# Patient Record
Sex: Male | Born: 2000 | Race: Black or African American | Hispanic: No | Marital: Single | Smoking: Never smoker
Health system: Southern US, Community
[De-identification: ages and names within clinical notes are randomized; demographics above are authoritative.]

## PROBLEM LIST (undated history)

## (undated) DIAGNOSIS — G4730 Sleep apnea, unspecified: Secondary | ICD-10-CM

## (undated) HISTORY — PX: OTHER SURGICAL HISTORY: SHX169

---
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
## 2022-04-01 ENCOUNTER — Other Ambulatory Visit: Payer: Self-pay

## 2022-04-01 ENCOUNTER — Emergency Department
Admission: EM | Admit: 2022-04-01 | Discharge: 2022-04-01 | Disposition: A | Discharge status: Home / Self Care | Payer: BC Managed Care – PPO | Attending: Emergency Medicine | Admitting: Emergency Medicine

## 2022-04-01 DIAGNOSIS — R509 Fever, unspecified: Secondary | ICD-10-CM | POA: Diagnosis present

## 2022-04-01 DIAGNOSIS — J101 Influenza due to other identified influenza virus with other respiratory manifestations: Secondary | ICD-10-CM | POA: Insufficient documentation

## 2022-04-01 DIAGNOSIS — Z20822 Contact with and (suspected) exposure to covid-19: Secondary | ICD-10-CM | POA: Insufficient documentation

## 2022-04-01 LAB — RESP PANEL BY RT-PCR (RSV, FLU A&B, COVID)  RVPGX2
Influenza A by PCR: POSITIVE — AB
Influenza B by PCR: NEGATIVE
Resp Syncytial Virus by PCR: NEGATIVE
SARS Coronavirus 2 by RT PCR: NEGATIVE

## 2022-04-01 MED ORDER — FLUTICASONE PROPIONATE 50 MCG/ACT NA SUSP: 1.0000 | Freq: Every day | NASAL | 2 refills | Status: DC

## 2022-04-01 MED ORDER — BENZONATATE 100 MG PO CAPS: 100.0000 mg | ORAL_CAPSULE | Freq: Three times a day (TID) | ORAL | 0 refills | Status: AC | PRN

## 2022-04-01 MED ORDER — ALBUTEROL SULFATE HFA 108 (90 BASE) MCG/ACT IN AERS: 2.0000 | INHALATION_SPRAY | Freq: Four times a day (QID) | RESPIRATORY_TRACT | 2 refills | Status: DC | PRN

## 2022-04-01 MED ORDER — ACETAMINOPHEN 325 MG PO TABS
650.0000 mg | ORAL_TABLET | Freq: Once | ORAL | Status: AC | PRN
  Administered 2022-04-01: 650 mg via ORAL
  Filled 2022-04-01: qty 2

## 2022-04-01 NOTE — ED Triage Notes (Signed)
Pt ambulatory to triage c/o flu like symptoms. Pt having cough, body aches, congestion, fever, chills since 2 days ago. Pt in NAD at this time.

## 2022-04-01 NOTE — ED Provider Notes (Signed)
[facility] Provider Note  Patient Contact: 10:20 PM (approximate)   History   No chief complaint on file.   HPI  Terry Becker is a 21 y.o. male presents to the emergency department with cough, fever, headache and bodyaches for the past 2 to 3 days.  No vomiting or diarrhea.  No chest pain or abdominal pain      Physical Exam   Triage Vital Signs: ED Triage Vitals  Enc Vitals Group     BP 04/01/22 1949 (!) 140/93     Pulse Rate 04/01/22 1949 (!) 114     Resp 04/01/22 1949 17     Temp 04/01/22 1949 (!) 100.9 F (38.3 C)     Temp Source 04/01/22 1949 Oral     SpO2 04/01/22 1949 97 %     Weight 04/01/22 1950 245 lb (111.1 kg)     Height 04/01/22 1950 5\' 10"  (1.778 m)     Head Circumference --      Peak Flow --      Pain Score 04/01/22 1949 6     Pain Loc --      Pain Edu? --      Excl. in [place]? --     Most recent vital signs: Vitals:   04/01/22 1949  BP: (!) 140/93  Pulse: (!) 114  Resp: 17  Temp: (!) 100.9 F (38.3 C)  SpO2: 97%     Constitutional: Alert and oriented. Patient is lying supine. Eyes: Conjunctivae are normal. PERRL. EOMI. Head: Atraumatic. ENT:      Ears: Tympanic membranes are mildly injected with mild effusion bilaterally.       Nose: No congestion/rhinnorhea.      Mouth/Throat: Mucous membranes are moist. Posterior pharynx is mildly erythematous.  Hematological/Lymphatic/Immunilogical: No cervical lymphadenopathy.  Cardiovascular: Normal rate, regular rhythm. Normal S1 and S2.  Good peripheral circulation. Respiratory: Normal respiratory effort without tachypnea or retractions. Lungs CTAB. Good air entry to the bases with no decreased or absent breath sounds. Gastrointestinal: Bowel sounds 4 quadrants. Soft and nontender to palpation. No guarding or rigidity. No palpable masses. No distention. No CVA tenderness. Musculoskeletal: Full range of motion to all extremities. No gross deformities  appreciated. Neurologic:  Normal speech and language. No gross focal neurologic deficits are appreciated.  Skin:  Skin is warm, dry and intact. No rash noted. Psychiatric: Mood and affect are normal. Speech and behavior are normal. Patient exhibits appropriate insight and judgement.   ED Results / Procedures / Treatments   Labs (all labs ordered are listed, but only abnormal results are displayed) Labs Reviewed  RESP PANEL BY RT-PCR (RSV, FLU A&B, COVID)  [id number] - Abnormal; Notable for the following components:      Result Value   Influenza A by PCR POSITIVE (*)    All other components within normal limits        PROCEDURES:  Critical Care performed: No  Procedures   MEDICATIONS ORDERED IN ED: Medications  acetaminophen (TYLENOL) tablet 650 mg (650 mg Oral Given 04/01/22 1955)     IMPRESSION / MDM / ASSESSMENT AND PLAN / ED COURSE  I reviewed the triage vital signs and the nursing notes.                              Assessment and plan Influenza A 21 year old male presents to the emergency department with flulike symptoms.  Patient tested positive for influenza A.  Rest medication..  Supportive medications were prescribed at discharge.  Patient     FINAL CLINICAL IMPRESSION(S) / ED DIAGNOSES   Final diagnoses:  Influenza A     Rx / DC Orders   ED Discharge Orders          Ordered    benzonatate (TESSALON PERLES) 100 MG capsule  3 times daily PRN        04/01/22 2217    benzonatate (TESSALON PERLES) 100 MG capsule  3 times daily PRN        04/01/22 2217    albuterol (VENTOLIN HFA) 108 (90 Base) MCG/ACT inhaler  Every 6 hours PRN        04/01/22 2217    fluticasone (FLONASE) 50 MCG/ACT nasal spray  Daily        04/01/22 2217             Note:  This document was prepared using Dragon voice recognition software and may include unintentional dictation errors.   Pia Mau Gurdon, Cordelia Poche 04/01/22 2221    Phineas Semen, MD 04/01/22 2325

## 2022-04-24 ENCOUNTER — Emergency Department
Admission: EM | Admit: 2022-04-24 | Discharge: 2022-04-24 | Disposition: A | Discharge status: Home / Self Care | Payer: BC Managed Care – PPO | Attending: Emergency Medicine | Admitting: Emergency Medicine

## 2022-04-24 ENCOUNTER — Other Ambulatory Visit: Payer: Self-pay

## 2022-04-24 DIAGNOSIS — R1010 Upper abdominal pain, unspecified: Secondary | ICD-10-CM | POA: Diagnosis present

## 2022-04-24 DIAGNOSIS — R109 Unspecified abdominal pain: Secondary | ICD-10-CM

## 2022-04-24 DIAGNOSIS — R112 Nausea with vomiting, unspecified: Secondary | ICD-10-CM | POA: Insufficient documentation

## 2022-04-24 LAB — CBC
HCT: 45.7 % (ref 39.0–52.0)
Hemoglobin: 14.4 g/dL (ref 13.0–17.0)
MCH: 24.4 pg — ABNORMAL LOW (ref 26.0–34.0)
MCHC: 31.5 g/dL (ref 30.0–36.0)
MCV: 77.5 fL — ABNORMAL LOW (ref 80.0–100.0)
Platelets: 387 10*3/uL (ref 150–400)
RBC: 5.9 MIL/uL — ABNORMAL HIGH (ref 4.22–5.81)
RDW: 15.7 % — ABNORMAL HIGH (ref 11.5–15.5)
WBC: 9.3 10*3/uL (ref 4.0–10.5)
nRBC: 0 % (ref 0.0–0.2)

## 2022-04-24 LAB — URINALYSIS, ROUTINE W REFLEX MICROSCOPIC
Bilirubin Urine: NEGATIVE
Glucose, UA: NEGATIVE mg/dL
Hgb urine dipstick: NEGATIVE
Ketones, ur: NEGATIVE mg/dL
Leukocytes,Ua: NEGATIVE
Nitrite: NEGATIVE
Protein, ur: NEGATIVE mg/dL
Specific Gravity, Urine: 1.013 (ref 1.005–1.030)
pH: 7 (ref 5.0–8.0)

## 2022-04-24 LAB — COMPREHENSIVE METABOLIC PANEL
ALT: 55 U/L — ABNORMAL HIGH (ref 0–44)
AST: 32 U/L (ref 15–41)
Albumin: 4.5 g/dL (ref 3.5–5.0)
Alkaline Phosphatase: 84 U/L (ref 38–126)
Anion gap: 5 (ref 5–15)
BUN: 10 mg/dL (ref 6–20)
CO2: 29 mmol/L (ref 22–32)
Calcium: 9.4 mg/dL (ref 8.9–10.3)
Chloride: 103 mmol/L (ref 98–111)
Creatinine, Ser: 0.95 mg/dL (ref 0.61–1.24)
GFR, Estimated: >60 mL/min (ref >60)
Glucose, Bld: 103 mg/dL — ABNORMAL HIGH (ref 70–99)
Potassium: 4 mmol/L (ref 3.5–5.1)
Sodium: 137 mmol/L (ref 135–145)
Total Bilirubin: 0.9 mg/dL (ref 0.3–1.2)
Total Protein: 7.9 g/dL (ref 6.5–8.1)

## 2022-04-24 LAB — LIPASE, BLOOD: Lipase: 30 U/L (ref 11–51)

## 2022-04-24 MED ORDER — DICYCLOMINE HCL 10 MG PO CAPS
20.0000 mg | ORAL_CAPSULE | Freq: Once | ORAL | Status: AC
  Administered 2022-04-24: 20 mg via ORAL
  Filled 2022-04-24: qty 2

## 2022-04-24 MED ORDER — ALUM & MAG HYDROXIDE-SIMETH 200-200-20 MG/5ML PO SUSP
30.0000 mL | Freq: Once | ORAL | Status: AC
  Administered 2022-04-24: 30 mL via ORAL
  Filled 2022-04-24: qty 30

## 2022-04-24 MED ORDER — LIDOCAINE VISCOUS HCL 2 % MT SOLN
15.0000 mL | Freq: Once | OROMUCOSAL | Status: AC
  Administered 2022-04-24: 15 mL via ORAL
  Filled 2022-04-24: qty 15

## 2022-04-24 MED ORDER — ONDANSETRON HCL 4 MG PO TABS: 4.0000 mg | ORAL_TABLET | Freq: Three times a day (TID) | ORAL | 0 refills | Status: DC | PRN

## 2022-04-24 MED ORDER — DICYCLOMINE HCL 10 MG PO CAPS: 10.0000 mg | ORAL_CAPSULE | Freq: Three times a day (TID) | ORAL | 0 refills | Status: DC | PRN

## 2022-04-24 NOTE — ED Provider Notes (Signed)
[facility] Provider Note    Event Date/Time   First MD Initiated Contact with Patient 04/24/22 [phone]     (approximate)   History   Abdominal Pain   HPI  Terry Becker is a 22 y.o. male  who presents to the emergency department today because of concern for abdominal pain. Started yesterday. Located across the upper part of his stomach. Described as both sharp and cramping in nature. The patient did have nausea and one episode of vomiting today. Denies any diarrhea or blood stool. Denies any history of abdominal issues or abdominal surgery. No fevers.       Physical Exam   Triage Vital Signs: ED Triage Vitals  Enc Vitals Group     BP 04/24/22 0906 (!) 155/100     Pulse Rate 04/24/22 0906 61     Resp 04/24/22 0906 18     Temp 04/24/22 0906 99 F (37.2 C)     Temp Source 04/24/22 0906 Oral     SpO2 04/24/22 0906 99 %     Weight 04/24/22 0913 240 lb (108.9 kg)     Height 04/24/22 0913 5\' 10"  (1.778 m)     Head Circumference --      Peak Flow --      Pain Score 04/24/22 0913 6     Pain Loc --      Pain Edu? --      Excl. in [place]? --     Most recent vital signs: Vitals:   04/24/22 0906  BP: (!) 155/100  Pulse: 61  Resp: 18  Temp: 99 F (37.2 C)  SpO2: 99%   General: Awake, alert, oriented. CV:  Good peripheral perfusion. Regular rate and rhythm. Resp:  Normal effort. Lungs clear. Abd:  No distention. Minimally tender across the upper abdomen.    ED Results / Procedures / Treatments   Labs (all labs ordered are listed, but only abnormal results are displayed) Labs Reviewed  COMPREHENSIVE METABOLIC PANEL - Abnormal; Notable for the following components:      Result Value   Glucose, Bld 103 (*)    ALT 55 (*)    All other components within normal limits  CBC - Abnormal; Notable for the following components:   RBC 5.90 (*)    MCV 77.5 (*)    MCH 24.4 (*)    RDW 15.7 (*)    All other components within normal limits  LIPASE,  BLOOD  URINALYSIS, ROUTINE W REFLEX MICROSCOPIC     EKG  None   RADIOLOGY None   PROCEDURES:  Critical Care performed: No  Procedures   MEDICATIONS ORDERED IN ED: Medications  alum & mag hydroxide-simeth (MAALOX/MYLANTA) 200-200-20 MG/5ML suspension 30 mL (30 mLs Oral Given 04/24/22 0949)    And  lidocaine (XYLOCAINE) 2 % viscous mouth solution 15 mL (15 mLs Oral Given 04/24/22 0949)     IMPRESSION / MDM / ASSESSMENT AND PLAN / ED COURSE  I reviewed the triage vital signs and the nursing notes.                              Differential diagnosis includes, but is not limited to, gastroenteritis, hepatitis, gallbladder disease, pancreatitis, gastritis.  Patient's presentation is most consistent with acute presentation with potential threat to life or bodily function.   Patient presented to the emergency department today because of concern for abdominal pain. Patient is afebrile here. No leukocytosis.  Tender across the top of his abdomen. Given lack of focal tenderness, fever or leukocytosis I do have low concern for significant abdominal infection. Patient did get some relief after bentyl. At this time I think gastroenteritis likely. Discussed with the patient. Will plan on discharging with medication. Discussed return precautions.      FINAL CLINICAL IMPRESSION(S) / ED DIAGNOSES   Final diagnoses:  Abdominal pain, unspecified abdominal location  Nausea and vomiting, unspecified vomiting type     Note:  This document was prepared using Dragon voice recognition software and may include unintentional dictation errors.    Nance Pear, MD 04/24/22 1218

## 2022-04-24 NOTE — Discharge Instructions (Signed)
Please seek medical attention for any high fevers, chest pain, shortness of breath, change in behavior, persistent vomiting, bloody stool or any other new or concerning symptoms.  

## 2022-04-24 NOTE — ED Triage Notes (Signed)
Pt states coming in for abdominal pain that started last night, but got worse this morning. Pt states vomiting, but no diarrhea. Pt states pa in is being consistent. Pt states pain started after taking shots of tequila.

## 2022-09-10 ENCOUNTER — Ambulatory Visit
Admission: EM | Admit: 2022-09-10 | Discharge: 2022-09-10 | Disposition: A | Discharge status: Home / Self Care | Payer: BC Managed Care – PPO | Attending: Emergency Medicine | Admitting: Emergency Medicine

## 2022-09-10 DIAGNOSIS — M26622 Arthralgia of left temporomandibular joint: Secondary | ICD-10-CM

## 2022-09-10 DIAGNOSIS — H1032 Unspecified acute conjunctivitis, left eye: Secondary | ICD-10-CM | POA: Diagnosis not present

## 2022-09-10 MED ORDER — POLYMYXIN B-TRIMETHOPRIM 10000-0.1 UNIT/ML-% OP SOLN: 1.0000 [drp] | Freq: Four times a day (QID) | OPHTHALMIC | 0 refills | Status: AC

## 2022-09-10 NOTE — ED Triage Notes (Signed)
Patient to Urgent Care with complaints of eye redness/ itching/ drainage. Reports rubbing at his eye two days ago. Constant watering with some crusting.  Also reports some left sided upper dental pain- reports pain when chewing. Possibly wisdom tooth related.

## 2022-09-10 NOTE — ED Provider Notes (Signed)
[facility]    CSN: [id number] Arrival date & time: 09/10/22  1422      History   Chief Complaint Chief Complaint  Patient presents with   Eye Problem    HPI Terry Becker is a 22 y.o. male.  Patient presents with left eye redness, itching, drainage, crusting x 2 days.  He also reports pain in his left jaw when chewing and opening his mouth.  He denies tooth ache, difficulty swallowing, sore throat, ear pain, fever, chills, or other symptoms.  No treatments at home.  No pertinent medical history.  The history is provided by the patient and medical records.    History reviewed. No pertinent past medical history.  There are no problems to display for this patient.   History reviewed. No pertinent surgical history.     Home Medications    Prior to Admission medications   Medication Sig Start Date End Date Taking? Authorizing Provider  trimethoprim-polymyxin b (POLYTRIM) ophthalmic solution Place 1 drop into both eyes 4 (four) times daily for 7 days. 09/10/22 09/17/22 Yes Mickie Bail, NP  albuterol (VENTOLIN HFA) 108 (90 Base) MCG/ACT inhaler Inhale 2 puffs into the lungs every 6 (six) hours as needed for wheezing or shortness of breath. 04/01/22   Orvil Feil, PA-C  dicyclomine (BENTYL) 10 MG capsule Take 1 capsule (10 mg total) by mouth 3 (three) times daily as needed (abdominal pain). 04/24/22   Phineas Semen, MD  fluticasone (FLONASE) 50 MCG/ACT nasal spray Place 1 spray into both nostrils daily for 7 days. 04/01/22 04/08/22  Orvil Feil, PA-C  ondansetron (ZOFRAN) 4 MG tablet Take 1 tablet (4 mg total) by mouth every 8 (eight) hours as needed. 04/24/22   Phineas Semen, MD    Family History History reviewed. No pertinent family history.  Social History Social History   Tobacco Use   Smoking status: Never   Smokeless tobacco: Never     Allergies   Patient has no known allergies.   Review of Systems Review of Systems  Constitutional:   Negative for chills and fever.  HENT:  Negative for ear pain, sore throat, trouble swallowing and voice change.        Left jaw pain.  Eyes:  Positive for discharge, redness and itching. Negative for pain and visual disturbance.  Respiratory:  Negative for cough and shortness of breath.   Cardiovascular:  Negative for chest pain and palpitations.     Physical Exam Triage Vital Signs ED Triage Vitals  Enc Vitals Group     BP 09/10/22 1441 130/82     Pulse Rate 09/10/22 1436 81     Resp 09/10/22 1436 18     Temp 09/10/22 1436 97.7 F (36.5 C)     Temp src --      SpO2 09/10/22 1436 99 %     Weight --      Height --      Head Circumference --      Peak Flow --      Pain Score 09/10/22 1438 0     Pain Loc --      Pain Edu? --      Excl. in [place]? --    No data found.  Updated Vital Signs BP 130/82   Pulse 92   Temp 97.8 F (36.6 C)   Resp 18   SpO2 97%   Visual Acuity Right Eye Distance:   Left Eye Distance:   Bilateral Distance:  Right Eye Near:   Left Eye Near:    Bilateral Near:     Physical Exam Vitals and nursing note reviewed.  Constitutional:      General: He is not in acute distress.    Appearance: Normal appearance. He is well-developed. He is not ill-appearing.  HENT:     Right Ear: Tympanic membrane normal.     Left Ear: Tympanic membrane normal.     Nose: Nose normal.     Mouth/Throat:     Mouth: Mucous membranes are moist.     Pharynx: Oropharynx is clear.     Comments: Teeth in good repair.  Able to open and close jaw without difficulty. Eyes:     General: Lids are normal. Vision grossly intact.     Extraocular Movements: Extraocular movements intact.     Conjunctiva/sclera:     Left eye: Left conjunctiva is injected.     Pupils: Pupils are equal, round, and reactive to light.  Cardiovascular:     Rate and Rhythm: Normal rate and regular rhythm.     Heart sounds: Normal heart sounds.  Pulmonary:     Effort: Pulmonary effort is normal. No  respiratory distress.     Breath sounds: Normal breath sounds.  Musculoskeletal:     Cervical back: Neck supple.  Skin:    General: Skin is warm and dry.  Neurological:     Mental Status: He is alert.  Psychiatric:        Mood and Affect: Mood normal.        Behavior: Behavior normal.      UC Treatments / Results  Labs (all labs ordered are listed, but only abnormal results are displayed) Labs Reviewed - No data to display  EKG   Radiology No results found.  Procedures Procedures (including critical care time)  Medications Ordered in UC Medications - No data to display  Initial Impression / Assessment and Plan / UC Course  I have reviewed the triage vital signs and the nursing notes.  Pertinent labs & imaging results that were available during my care of the patient were reviewed by me and considered in my medical decision making (see chart for details).    Bacterial conjunctivitis of left eye.  Left TMJ arthralgia.  Treating eye symptoms with Polytrim eyedrops.  Education provided on bacterial conjunctivitis.  Treating jaw symptoms with ibuprofen and rest of TMJ.  Education provided on TMJ syndrome.  Instructed patient to follow up with his PCP or dentist if his symptoms are not improving.  He agrees to plan of care.    Final Clinical Impressions(s) / UC Diagnoses   Final diagnoses:  Acute bacterial conjunctivitis of left eye  Arthralgia of left temporomandibular joint     Discharge Instructions      Use the eye drops as directed.    Take ibuprofen and rest your jaw as discussed.    Follow up with your primary care provider if your symptoms are not improving.        ED Prescriptions     Medication Sig Dispense Auth. Provider   trimethoprim-polymyxin b (POLYTRIM) ophthalmic solution Place 1 drop into both eyes 4 (four) times daily for 7 days. 10 mL Mickie Bail, NP      PDMP not reviewed this encounter.   Mickie Bail, NP 09/10/22 [phone]

## 2022-09-10 NOTE — Discharge Instructions (Addendum)
Use the eye drops as directed.    Take ibuprofen and rest your jaw as discussed.    Follow up with your primary care provider if your symptoms are not improving.

## 2022-11-03 ENCOUNTER — Ambulatory Visit (HOSPITAL_COMMUNITY)
Admission: EM | Admit: 2022-11-03 | Discharge: 2022-11-03 | Disposition: A | Discharge status: Home / Self Care | Payer: BC Managed Care – PPO | Attending: Internal Medicine | Admitting: Internal Medicine

## 2022-11-03 ENCOUNTER — Encounter (HOSPITAL_COMMUNITY): Payer: Self-pay | Admitting: *Deleted

## 2022-11-03 DIAGNOSIS — Y939 Activity, unspecified: Secondary | ICD-10-CM | POA: Insufficient documentation

## 2022-11-03 DIAGNOSIS — W01198A Fall on same level from slipping, tripping and stumbling with subsequent striking against other object, initial encounter: Secondary | ICD-10-CM | POA: Insufficient documentation

## 2022-11-03 DIAGNOSIS — Y9289 Other specified places as the place of occurrence of the external cause: Secondary | ICD-10-CM | POA: Insufficient documentation

## 2022-11-03 DIAGNOSIS — Y990 Civilian activity done for income or pay: Secondary | ICD-10-CM | POA: Insufficient documentation

## 2022-11-03 DIAGNOSIS — W19XXXA Unspecified fall, initial encounter: Secondary | ICD-10-CM

## 2022-11-03 DIAGNOSIS — S0083XA Contusion of other part of head, initial encounter: Secondary | ICD-10-CM | POA: Insufficient documentation

## 2022-11-03 DIAGNOSIS — R55 Syncope and collapse: Secondary | ICD-10-CM | POA: Insufficient documentation

## 2022-11-03 HISTORY — DX: Sleep apnea, unspecified: G47.30

## 2022-11-03 LAB — CBC
HCT: 45 % (ref 39.0–52.0)
Hemoglobin: 14.4 g/dL (ref 13.0–17.0)
MCH: 24.8 pg — ABNORMAL LOW (ref 26.0–34.0)
MCHC: 32 g/dL (ref 30.0–36.0)
MCV: 77.5 fL — ABNORMAL LOW (ref 80.0–100.0)
Platelets: 383 10*3/uL (ref 150–400)
RBC: 5.81 MIL/uL (ref 4.22–5.81)
RDW: 15.2 % (ref 11.5–15.5)
WBC: 6.2 10*3/uL (ref 4.0–10.5)
nRBC: 0 % (ref 0.0–0.2)

## 2022-11-03 LAB — BASIC METABOLIC PANEL
Anion gap: 14 (ref 5–15)
BUN: 14 mg/dL (ref 6–20)
CO2: 28 mmol/L (ref 22–32)
Calcium: 9.8 mg/dL (ref 8.9–10.3)
Chloride: 95 mmol/L — ABNORMAL LOW (ref 98–111)
Creatinine, Ser: 1.06 mg/dL (ref 0.61–1.24)
GFR, Estimated: >60 mL/min (ref >60)
Glucose, Bld: 99 mg/dL (ref 70–99)
Potassium: 3.7 mmol/L (ref 3.5–5.1)
Sodium: 137 mmol/L (ref 135–145)

## 2022-11-03 NOTE — ED Provider Notes (Signed)
[facility]    CSN: [id number] Arrival date & time: 11/03/22  1739      History   Chief Complaint Chief Complaint  Patient presents with   Fall   Loss of Consciousness    HPI Terry Becker is a 22 y.o. male.   Patient presents to urgent care for evaluation of syncopal episode that happened today while he was at work as a result of a mechanical fall.  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  This was a witnessed fall and coworkers report that patient lost consciousness for approximately 20 to 30 seconds.  Patient does not remember falling but was alert and oriented immediately upon regaining consciousness shortly after falling.  Currently complaining of mild head pain at the site of hematoma to the left forehead without generalized head pain.  Denies preceding dizziness, chest pain, shortness of breath, nausea, vomiting, or abdominal pain.  Denies vision changes after incident, nausea/vomiting after hitting head, and brain fog.  No paresthesias to the extremities, extremity weakness, loss of bowel/bladder continence, saddle anesthesia symptoms, low back pain, neck pain, dizziness, or drainage from the ears/nose.  He does have a forehead hematoma as a result of fall to the left forehead above the left eye.  He does not take blood thinners.  Admits to inadequate water intake, he had eaten today prior to fall.  This has never happened in the past.    Fall  Loss of Consciousness   Past Medical History:  Diagnosis Date   Sleep apnea     There are no problems to display for this patient.   Past Surgical History:  Procedure Laterality Date   Inspire Implant         Home Medications    Prior to Admission medications   Medication Sig Start Date End Date Taking? Authorizing Provider  albuterol (VENTOLIN HFA) 108 (90 Base) MCG/ACT inhaler Inhale 2 puffs into the lungs  every 6 (six) hours as needed for wheezing or shortness of breath. 04/01/22   Orvil Feil, PA-C  dicyclomine (BENTYL) 10 MG capsule Take 1 capsule (10 mg total) by mouth 3 (three) times daily as needed (abdominal pain). 04/24/22   Phineas Semen, MD  fluticasone (FLONASE) 50 MCG/ACT nasal spray Place 1 spray into both nostrils daily for 7 days. 04/01/22 04/08/22  Orvil Feil, PA-C  ondansetron (ZOFRAN) 4 MG tablet Take 1 tablet (4 mg total) by mouth every 8 (eight) hours as needed. 04/24/22   Phineas Semen, MD    Family History History reviewed. No pertinent family history.  Social History Social History   Tobacco Use   Smoking status: Never   Smokeless tobacco: Never  Vaping Use   Vaping status: Never Used  Substance Use Topics   Alcohol use: Yes   Drug use: Yes    Types: Marijuana     Allergies   Patient has no known allergies.   Review of Systems Review of Systems  Cardiovascular:  Positive for syncope.  Per HPI   Physical Exam Triage Vital Signs ED Triage Vitals  Encounter Vitals Group     BP 11/03/22 1750 135/86     Systolic BP Percentile --      Diastolic BP Percentile --      Pulse Rate 11/03/22 1750 66     Resp 11/03/22 1750 18     Temp 11/03/22 1750  98.9 F (37.2 C)     Temp Source 11/03/22 1750 Oral     SpO2 11/03/22 1750 100     Weight --      Height --      Head Circumference --      Peak Flow --      Pain Score 11/03/22 1747 4     Pain Loc --      Pain Education --      Exclude from Growth Chart --    No data found.  Updated Vital Signs BP 135/86 (BP Location: Right Arm)   Pulse 66   Temp 98.9 F (37.2 C) (Oral)   Resp 18   SpO2 100%   Visual Acuity Right Eye Distance:   Left Eye Distance:   Bilateral Distance:    Right Eye Near:   Left Eye Near:    Bilateral Near:     Physical Exam Vitals and nursing note reviewed.  Constitutional:      Appearance: He is not ill-appearing or toxic-appearing.  HENT:     Head:  Normocephalic. Contusion present. No raccoon eyes, Battle's sign, abrasion, right periorbital erythema or left periorbital erythema.      Comments: 3cm by 3cm hematoma present to the left forehead superior to the left eye. Non-draining/bleeding. Tender to palpation.    Right Ear: Hearing and external ear normal.     Left Ear: Hearing and external ear normal.     Nose: Nose normal.     Mouth/Throat:     Lips: Pink.  Eyes:     General: Lids are normal. Lids are everted, no foreign bodies appreciated. Vision grossly intact. Gaze aligned appropriately.     Extraocular Movements: Extraocular movements intact.     Conjunctiva/sclera: Conjunctivae normal.     Right eye: Right conjunctiva is not injected.     Left eye: Left conjunctiva is not injected.     Comments: EOMs intact without pain or dizziness.  Neck:     Trachea: Trachea and phonation normal.     Comments: No crepitus or step-off to palpation.  Nontender to palpation of the cervical, thoracic, and lumbar spine. Normal ROM. Pulmonary:     Effort: Pulmonary effort is normal.  Musculoskeletal:     Cervical back: Normal range of motion and neck supple. No edema, erythema, signs of trauma, rigidity, torticollis, tenderness or crepitus. No pain with movement, spinous process tenderness or muscular tenderness. Normal range of motion.  Lymphadenopathy:     Cervical: No cervical adenopathy.  Skin:    General: Skin is warm and dry.     Capillary Refill: Capillary refill takes less than 2 seconds.     Findings: No rash.  Neurological:     General: No focal deficit present.     Mental Status: He is alert and oriented to person, place, and time. Mental status is at baseline.     Cranial Nerves: Cranial nerves 2-12 are intact. No cranial nerve deficit, dysarthria or facial asymmetry.     Sensory: Sensation is intact.     Motor: Motor function is intact. No weakness or tremor.     Coordination: Coordination is intact. Romberg sign negative.  Coordination normal. Finger-Nose-Finger Test and Heel to [facility] Test normal.     Gait: Gait is intact. Gait normal.     Comments: Strength and sensation intact to bilateral upper and lower extremities (5/5). Moves all 4 extremities with normal coordination voluntarily. Non-focal neuro exam.   Psychiatric:  Mood and Affect: Mood normal.        Speech: Speech normal.        Behavior: Behavior normal.        Thought Content: Thought content normal.        Judgment: Judgment normal.      UC Treatments / Results  Labs (all labs ordered are listed, but only abnormal results are displayed) Labs Reviewed  CBC  BASIC METABOLIC PANEL    EKG   Radiology No results found.  Procedures Procedures (including critical care time)  Medications Ordered in UC Medications - No data to display  Initial Impression / Assessment and Plan / UC Course  I have reviewed the triage vital signs and the nursing notes.  Pertinent labs & imaging results that were available during my care of the patient were reviewed by me and considered in my medical decision making (see chart for details).   1.  Syncope, fall, traumatic hematoma of forehead Neurologic exam is intact to baseline without focal deficit.  EKG shows sinus bradycardia with sinus arrhythmia without any ST/T wave changes.  CBC and BMP drawn to assess for blood levels and electrolyte imbalances related to syncope.  Staff will call patient and report any abnormalities on blood work indicating change in treatment plan.  Low suspicion for subarachnoid hemorrhage, skull fracture, neck fracture, or any other emergent etiologies.  Deferred imaging based on stable musculoskeletal exam findings.  Low suspicion for concussion. Strict ER return precautions have been discussed.  May follow-up with neurology should he experience any new or worsening symptoms.  Recommend PCP follow-up in the next 3 to 5 days to ensure improvement.  May use Tylenol/ibuprofen  as needed for pain.  Ice to the forehead to reduce swelling.  Counseled patient on potential for adverse effects with medications prescribed/recommended today, strict ER and return-to-clinic precautions discussed, patient verbalized understanding.    Final Clinical Impressions(s) / UC Diagnoses   Final diagnoses:  Syncope, unspecified syncope type  Fall, initial encounter  Traumatic hematoma of forehead, initial encounter     Discharge Instructions      Your exam looks great today.  Your EKG looks wonderful and does not show any abnormalities with your heart.  I have gotten some blood work in the clinic and we will call you if there are any abnormalities on your blood work.  Apply ice to your forehead to reduce the swelling and the tenderness.  You may take Tylenol/ibuprofen as needed for pain.  If you start developing any new or worsening symptoms such as brain fog, fatigue, sensitivity to lights, vision changes, chest pain, shortness of breath, dizziness, abnormal bleeding, memory changes, arm weakness, leg weakness, numbness or tingling to your extremities, etc., please go to the nearest emergency room for further evaluation.     ED Prescriptions   None    PDMP not reviewed this encounter.   Carlisle Beers, [place] 11/03/22 1845

## 2022-11-03 NOTE — Discharge Instructions (Addendum)
Your exam looks great today.  Your EKG looks wonderful and does not show any abnormalities with your heart.  I have gotten some blood work in the clinic and we will call you if there are any abnormalities on your blood work.  Apply ice to your forehead to reduce the swelling and the tenderness.  You may take Tylenol/ibuprofen as needed for pain.  If you start developing any new or worsening symptoms such as brain fog, fatigue, sensitivity to lights, vision changes, chest pain, shortness of breath, dizziness, abnormal bleeding, memory changes, arm weakness, leg weakness, numbness or tingling to your extremities, etc., please go to the nearest emergency room for further evaluation.

## 2022-11-03 NOTE — ED Triage Notes (Signed)
Pt states he was talking to a co worker got up to walk out of the office and tripped he hit the left side of his head on the copier. He states his co workers said he LOC about 30 seconds. The injury happened about 4:15-4:30pm. He does have a knot in the area.

## 2022-11-24 NOTE — Progress Notes (Deleted)
  [facility] [address] [place] [address] Dept: [phone] Dept Fax: [phone]  New Patient Office Visit  Subjective:   Terry Becker [birth year] 11/25/2022  No chief complaint on file.   HPI: Terry Becker presents today to establish care at [facility] [employer]. Introduced to Publishing rights manager role and practice setting.  All questions answered.  Concerns: See below   Discussed the use of AI scribe software for clinical note transcription with the patient, who gave verbal consent to proceed.  History of Present Illness            The following portions of the patient's history were reviewed and updated as appropriate: past medical history, past surgical history, family history, social history, allergies, medications, and problem list.   There are no problems to display for this patient.  Past Medical History:  Diagnosis Date   Sleep apnea    Past Surgical History:  Procedure Laterality Date   Inspire Implant     No family history on file. Outpatient Medications Prior to Visit  Medication Sig Dispense Refill   albuterol (VENTOLIN HFA) 108 (90 Base) MCG/ACT inhaler Inhale 2 puffs into the lungs every 6 (six) hours as needed for wheezing or shortness of breath. 8 g 2   dicyclomine (BENTYL) 10 MG capsule Take 1 capsule (10 mg total) by mouth 3 (three) times daily as needed (abdominal pain). 20 capsule 0   fluticasone (FLONASE) 50 MCG/ACT nasal spray Place 1 spray into both nostrils daily for 7 days. 9.9 mL 2   ondansetron (ZOFRAN) 4 MG tablet Take 1 tablet (4 mg total) by mouth every 8 (eight) hours as needed. 20 tablet 0   No facility-administered medications prior to visit.   No Known Allergies  ROS: A complete ROS was performed with pertinent positives/negatives noted in the HPI. The remainder of the ROS are negative.   Objective:   There were no vitals filed for this  visit.  GENERAL: Well-appearing, in NAD. Well nourished.  SKIN: Pink, warm and dry. No rash, lesion, ulceration, or ecchymoses.  NECK: Trachea midline. Full ROM w/o pain or tenderness. No lymphadenopathy.  RESPIRATORY: Chest wall symmetrical. Respirations even and non-labored. Breath sounds clear to auscultation bilaterally.  CARDIAC: S1, S2 present, regular rate and rhythm. Peripheral pulses 2+ bilaterally.  MSK: Muscle tone and strength appropriate for age. Joints w/o tenderness, redness, or swelling.  EXTREMITIES: Without clubbing, cyanosis, or edema.  NEUROLOGIC: No motor or sensory deficits. Steady, even gait.  PSYCH/MENTAL STATUS: Alert, oriented x 3. Cooperative, appropriate mood and affect.   Health Maintenance Due  Topic Date Due   HPV VACCINES (1 - Male 3-dose series) Never done   HIV Screening  Never done   Hepatitis C Screening  Never done   DTaP/Tdap/Td (1 - Tdap) Never done   COVID-19 Vaccine (1 - 2023-24 season) Never done   INFLUENZA VACCINE  11/10/2022    No results found for any visits on 11/25/22.  Assessment & Plan:  Assessment and Plan               There are no diagnoses linked to this encounter.   No follow-ups on file.   Of note, portions of this note may have been created with voice recognition software Physicist, medical). While this note has been edited for accuracy, occasional wrong-word or 'sound-a-like' substitutions may have occurred due to the inherent limitations of voice recognition software.  Salvatore Decent, FNP

## 2022-11-25 ENCOUNTER — Telehealth: Payer: Self-pay | Admitting: Internal Medicine

## 2022-11-25 ENCOUNTER — Ambulatory Visit: Payer: BC Managed Care – PPO | Admitting: Internal Medicine

## 2022-11-25 DIAGNOSIS — S0083XD Contusion of other part of head, subsequent encounter: Secondary | ICD-10-CM

## 2022-11-25 NOTE — Telephone Encounter (Signed)
11/25/22 - Np did not show up for appt. Pt is blocked for future schedule with all providers.

## 2023-08-07 ENCOUNTER — Ambulatory Visit (HOSPITAL_COMMUNITY)
Admission: EM | Admit: 2023-08-07 | Discharge: 2023-08-07 | Disposition: A | Discharge status: Home / Self Care | Payer: Worker's Compensation | Attending: Physician Assistant | Admitting: Physician Assistant

## 2023-08-07 ENCOUNTER — Encounter (HOSPITAL_COMMUNITY): Payer: Self-pay | Admitting: Emergency Medicine

## 2023-08-07 ENCOUNTER — Other Ambulatory Visit: Payer: Self-pay

## 2023-08-07 ENCOUNTER — Ambulatory Visit (HOSPITAL_COMMUNITY): Payer: Worker's Compensation | Attending: Physician Assistant

## 2023-08-07 DIAGNOSIS — S40021A Contusion of right upper arm, initial encounter: Secondary | ICD-10-CM | POA: Diagnosis not present

## 2023-08-07 DIAGNOSIS — M25511 Pain in right shoulder: Secondary | ICD-10-CM

## 2023-08-07 DIAGNOSIS — M25531 Pain in right wrist: Secondary | ICD-10-CM

## 2023-08-07 MED ORDER — NAPROXEN 500 MG PO TABS: 500.0000 mg | ORAL_TABLET | Freq: Two times a day (BID) | ORAL | 0 refills | Status: AC
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
## 2023-08-07 NOTE — Discharge Instructions (Signed)
 I will contact you if the radiologist sees something on your x-ray that changes our treatment plan.  Use heat and gentle stretch to help with the discomfort.  Avoid strenuous activity including heavy lifting for the next several days.  Take Naprosyn twice daily.  Do not take additional NSAIDs with this medication including aspirin, ibuprofen/Advil, naproxen/Aleve.  Follow-up with sports medicine for further evaluation and management.  If anything worsens you have increasing pain, numbness or tingling in your hand, difficulty moving your arm you need to be seen immediately.

## 2023-08-18 ENCOUNTER — Ambulatory Visit: Admitting: Family Medicine

## 2023-08-29 ENCOUNTER — Ambulatory Visit: Admitting: Family Medicine

## 2023-08-30 ENCOUNTER — Ambulatory Visit: Admitting: Family Medicine

## 2023-09-06 ENCOUNTER — Encounter: Payer: Self-pay | Admitting: Family Medicine

## 2023-10-20 ENCOUNTER — Ambulatory Visit: Payer: Self-pay | Admitting: Family Medicine

## 2023-10-20 ENCOUNTER — Encounter: Payer: Self-pay | Admitting: Family Medicine

## 2023-10-20 VITALS — BP 128/82 | Temp 99.0°F | Ht 70.0 in | Wt 242.0 lb

## 2023-10-20 DIAGNOSIS — E782 Mixed hyperlipidemia: Secondary | ICD-10-CM | POA: Diagnosis not present

## 2023-10-20 DIAGNOSIS — Z0001 Encounter for general adult medical examination with abnormal findings: Secondary | ICD-10-CM

## 2023-10-20 DIAGNOSIS — Z114 Encounter for screening for human immunodeficiency virus [HIV]: Secondary | ICD-10-CM

## 2023-10-20 DIAGNOSIS — R7301 Impaired fasting glucose: Secondary | ICD-10-CM

## 2023-10-20 DIAGNOSIS — E559 Vitamin D deficiency, unspecified: Secondary | ICD-10-CM

## 2023-10-20 DIAGNOSIS — Z1159 Encounter for screening for other viral diseases: Secondary | ICD-10-CM

## 2023-10-20 DIAGNOSIS — E038 Other specified hypothyroidism: Secondary | ICD-10-CM | POA: Diagnosis not present

## 2023-10-20 NOTE — Patient Instructions (Signed)

## 2023-10-20 NOTE — Progress Notes (Signed)
 Complete physical exam  Patient: Terry Becker   DOB: 03-28-2001   23 y.o. Male  MRN: [id number]  Subjective:    Chief Complaint  Patient presents with   New Patient (Initial Visit)    New pt, establishing care  , pt stated he fasting if lab need to be done     Terry Becker is a 23 y.o. male who presents today for a complete physical exam. He reports consuming a general diet. The patient does not participate in regular exercise at present. He generally feels well. He reports sleeping well. He does have additional problems to discuss today.    Most recent fall risk assessment:     No data to display           Most recent depression screenings:    10/20/2023    1:36 PM  PHQ 2/9 Scores  PHQ - 2 Score 0  PHQ- 9 Score 1    Vision:Not within last year  and Dental: No current dental problems and Receives regular dental care  Patient Care Team: Tobie Border, MD as PCP - General (Family Medicine)   Outpatient Medications Prior to Visit  Medication Sig   amoxicillin (AMOXIL) 875 MG tablet Take 875 mg by mouth.   naproxen  (NAPROSYN ) 500 MG tablet Take 1 tablet (500 mg total) by mouth 2 (two) times daily. (Patient not taking: Reported on 10/20/2023)   No facility-administered medications prior to visit.    Review of Systems  Constitutional:  Negative for chills and fever.  HENT:  Negative for ear pain.   Eyes:  Negative for blurred vision.  Respiratory:  Negative for shortness of breath.   Cardiovascular:  Negative for chest pain.  Gastrointestinal:  Negative for abdominal pain.  Genitourinary:  Negative for dysuria.  Musculoskeletal:  Negative for myalgias.  Skin:  Negative for rash.  Neurological:  Negative for dizziness and headaches.  Psychiatric/Behavioral:  The patient does not have insomnia.        Objective:    BP 128/82   Temp 99 F (37.2 C) (Oral)   Ht 5' 10 (1.778 m)   Wt 242 lb (109.8 kg)   SpO2 95%   BMI 34.72 kg/m  BP Readings from  Last 3 Encounters:  10/20/23 128/82  08/07/23 136/82  11/03/22 135/86      Physical Exam Vitals reviewed.  Constitutional:      General: He is not in acute distress.    Appearance: Normal appearance. He is not ill-appearing, toxic-appearing or diaphoretic.  HENT:     Head: Normocephalic.     Mouth/Throat:     Mouth: Mucous membranes are moist.  Eyes:     General:        Right eye: No discharge.        Left eye: No discharge.     Conjunctiva/sclera: Conjunctivae normal.     Pupils: Pupils are equal, round, and reactive to light.  Cardiovascular:     Rate and Rhythm: Normal rate.     Pulses: Normal pulses.     Heart sounds: Normal heart sounds.  Pulmonary:     Effort: Pulmonary effort is normal. No respiratory distress.     Breath sounds: Normal breath sounds.  Abdominal:     General: Bowel sounds are normal.     Palpations: Abdomen is soft.     Tenderness: There is no abdominal tenderness. There is no right CVA tenderness, left CVA tenderness or guarding.  Musculoskeletal:  General: Normal range of motion.     Cervical back: Normal range of motion.  Skin:    General: Skin is warm and dry.  Neurological:     General: No focal deficit present.     Mental Status: He is alert and oriented to person, place, and time.     Coordination: Coordination normal.     Gait: Gait normal.  Psychiatric:        Mood and Affect: Mood normal.        Behavior: Behavior normal.        Thought Content: Thought content normal.      No results found for any visits on 10/20/23.    Assessment & Plan:    Routine Health Maintenance and Physical Exam  Immunization History  Administered Date(s) Administered   DTaP 12/21/1998, 02/22/1999, 05/03/1999, 03/15/2000, 07/03/2003   Fluzone Influenza virus vaccine,trivalent (IIV3), split virus 02/23/2011   HIB (PRP-OMP) 12/21/1998, 02/22/1999, 05/03/1999   HPV Quadrivalent 11/17/2010, 02/23/2011, 01/31/2012   Hep B, Unspecified May 28, 1998,  12/21/1998, 05/03/1999   Hepatitis A, Ped/Adol-2 Dose 10/16/2009, 05/17/2010   IPV 12/21/1998, 02/22/1999, 10/21/1999, 07/03/2003   Influenza, Seasonal, Injecte, Preservative Fre 01/21/2009, 05/17/2010, 01/31/2012   Influenza,inj,Quad PF,6+ Mos 01/25/2014, 05/04/2016, 02/19/2018, 03/26/2019   MMR 10/21/1999, 07/03/2003   Meningococcal Conjugate 10/16/2009   Tdap 10/16/2009   Varicella 03/16/2007, 10/16/2009    Health Maintenance  Topic Date Due   HIV Screening  Never done   Hepatitis C Screening  Never done   Hepatitis B Vaccines (1 of 3 - 19+ 3-dose series) Never done   DTaP/Tdap/Td (7 - Td or Tdap) 10/17/2019   COVID-19 Vaccine (1 - 2024-25 season) 11/05/2023 (Originally 12/11/2022)   INFLUENZA VACCINE  11/10/2023   HPV VACCINES  Completed   Meningococcal B Vaccine  Aged Out    Discussed health benefits of physical activity, and encouraged him to engage in regular exercise appropriate for his age and condition.  Mixed hyperlipidemia -     Lipid panel -     CMP14+EGFR -     CBC with Differential/Platelet  Encounter for screening for HIV -     HIV Antibody (routine testing w rflx)  Need for hepatitis C screening test -     Hepatitis C antibody  TSH (thyroid-stimulating hormone deficiency) -     TSH + free T4  IFG (impaired fasting glucose) -     Hemoglobin A1c  Vitamin D  deficiency -     VITAMIN D  25 Hydroxy (Vit-D Deficiency, Fractures)  Encounter for routine adult physical exam with abnormal findings Assessment & Plan: A comprehensive physical examination was completed, and necessary labs were ordered. Screening and health maintenance recommendations have been updated. The patient received counseling on exercise and nutrition. BMI was assessed and discussed Advise for heart health, focus on: Eat more fruits and vegetables: Aim for a variety of colors. Choose whole grains: Brown rice, oats, and whole-wheat bread. Limit unhealthy fats: Avoid trans fats; use olive or  avocado oil instead. Include lean proteins: Opt for fish, chicken, beans, and legumes. Reduce sodium: Limit processed foods and add less salt. Stay hydrated: Drink plenty of water. Exercise regularly: Aim for at least 30 minutes of moderate exercise, like walking or cycling, 5 days a week.       Return in about 1 year (around 10/19/2024), or if symptoms worsen or fail to improve, for routine labs, Annual Physical.     Hilario Kidd Wilhelmena Falter, FNP

## 2023-10-20 NOTE — Assessment & Plan Note (Signed)

## 2023-10-21 LAB — CBC WITH DIFFERENTIAL/PLATELET
Basophils Absolute: 0 x10E3/uL (ref 0.0–0.2)
Basos: 0 %
EOS (ABSOLUTE): 0.1 x10E3/uL (ref 0.0–0.4)
Eos: 2 %
Hematocrit: 45.9 % (ref 37.5–51.0)
Hemoglobin: 14.7 g/dL (ref 13.0–17.7)
Immature Grans (Abs): 0 x10E3/uL (ref 0.0–0.1)
Immature Granulocytes: 0 %
Lymphocytes Absolute: 1.8 x10E3/uL (ref 0.7–3.1)
Lymphs: 30 %
MCH: 25.5 pg — ABNORMAL LOW (ref 26.6–33.0)
MCHC: 32 g/dL (ref 31.5–35.7)
MCV: 80 fL (ref 79–97)
Monocytes Absolute: 0.8 x10E3/uL (ref 0.1–0.9)
Monocytes: 14 %
Neutrophils Absolute: 3.1 x10E3/uL (ref 1.4–7.0)
Neutrophils: 53 %
Platelets: 353 x10E3/uL (ref 150–450)
RBC: 5.76 x10E6/uL (ref 4.14–5.80)
RDW: 14.8 % (ref 11.6–15.4)
WBC: 5.9 x10E3/uL (ref 3.4–10.8)

## 2023-10-21 LAB — LIPID PANEL
Chol/HDL Ratio: 4.4 ratio (ref 0.0–5.0)
Cholesterol, Total: 148 mg/dL (ref 100–199)
HDL: 34 mg/dL — ABNORMAL LOW (ref >39)
LDL Chol Calc (NIH): 100 mg/dL — ABNORMAL HIGH (ref 0–99)
Triglycerides: 72 mg/dL (ref 0–149)
VLDL Cholesterol Cal: 14 mg/dL (ref 5–40)

## 2023-10-21 LAB — HEMOGLOBIN A1C
Est. average glucose Bld gHb Est-mCnc: 126 mg/dL
Hgb A1c MFr Bld: 6 % — ABNORMAL HIGH (ref 4.8–5.6)

## 2023-10-21 LAB — CMP14+EGFR
ALT: 50 IU/L — ABNORMAL HIGH (ref 0–44)
AST: 34 IU/L (ref 0–40)
Albumin: 4.4 g/dL (ref 4.3–5.2)
Alkaline Phosphatase: 85 IU/L (ref 44–121)
BUN/Creatinine Ratio: 9 (ref 9–20)
BUN: 10 mg/dL (ref 6–20)
Bilirubin Total: 0.4 mg/dL (ref 0.0–1.2)
CO2: 23 mmol/L (ref 20–29)
Calcium: 9.6 mg/dL (ref 8.7–10.2)
Chloride: 102 mmol/L (ref 96–106)
Creatinine, Ser: 1.06 mg/dL (ref 0.76–1.27)
Globulin, Total: 2.7 g/dL (ref 1.5–4.5)
Glucose: 90 mg/dL (ref 70–99)
Potassium: 4.3 mmol/L (ref 3.5–5.2)
Sodium: 140 mmol/L (ref 134–144)
Total Protein: 7.1 g/dL (ref 6.0–8.5)
eGFR: 100 mL/min/1.73

## 2023-10-21 LAB — HIV ANTIBODY (ROUTINE TESTING W REFLEX): HIV Screen 4th Generation wRfx: NONREACTIVE

## 2023-10-21 LAB — HEPATITIS C ANTIBODY: Hep C Virus Ab: NONREACTIVE

## 2023-10-21 LAB — TSH+FREE T4
Free T4: 1.12 ng/dL (ref 0.82–1.77)
TSH: 0.87 u[IU]/mL (ref 0.450–4.500)

## 2023-10-21 LAB — VITAMIN D 25 HYDROXY (VIT D DEFICIENCY, FRACTURES): Vit D, 25-Hydroxy: 12.1 ng/mL — ABNORMAL LOW (ref 30.0–100.0)

## 2023-10-25 ENCOUNTER — Ambulatory Visit: Payer: Self-pay | Admitting: Family Medicine

## 2024-10-25 ENCOUNTER — Encounter: Admitting: Family Medicine
